# Patient Record
Sex: Female | Born: 1935 | Race: White | Hispanic: No | Smoking: Never smoker
Health system: Southern US, Community
[De-identification: ages and names within clinical notes are randomized; demographics above are authoritative.]

## PROBLEM LIST (undated history)

## (undated) DIAGNOSIS — D45 Polycythemia vera: Secondary | ICD-10-CM

## (undated) DIAGNOSIS — H409 Unspecified glaucoma: Secondary | ICD-10-CM

## (undated) DIAGNOSIS — Z8719 Personal history of other diseases of the digestive system: Secondary | ICD-10-CM

## (undated) DIAGNOSIS — K219 Gastro-esophageal reflux disease without esophagitis: Secondary | ICD-10-CM

## (undated) DIAGNOSIS — R0600 Dyspnea, unspecified: Secondary | ICD-10-CM

## (undated) DIAGNOSIS — E785 Hyperlipidemia, unspecified: Secondary | ICD-10-CM

## (undated) DIAGNOSIS — K851 Biliary acute pancreatitis without necrosis or infection: Secondary | ICD-10-CM

## (undated) DIAGNOSIS — N186 End stage renal disease: Secondary | ICD-10-CM

## (undated) DIAGNOSIS — F329 Major depressive disorder, single episode, unspecified: Secondary | ICD-10-CM

## (undated) DIAGNOSIS — F32A Depression, unspecified: Secondary | ICD-10-CM

## (undated) DIAGNOSIS — I441 Atrioventricular block, second degree: Secondary | ICD-10-CM

## (undated) DIAGNOSIS — I10 Essential (primary) hypertension: Secondary | ICD-10-CM

## (undated) DIAGNOSIS — D471 Chronic myeloproliferative disease: Secondary | ICD-10-CM

## (undated) DIAGNOSIS — Z950 Presence of cardiac pacemaker: Secondary | ICD-10-CM

## (undated) DIAGNOSIS — D649 Anemia, unspecified: Secondary | ICD-10-CM

## (undated) DIAGNOSIS — M1990 Unspecified osteoarthritis, unspecified site: Secondary | ICD-10-CM

## (undated) HISTORY — PX: OTHER SURGICAL HISTORY: SHX169

## (undated) HISTORY — DX: Essential (primary) hypertension: I10

## (undated) HISTORY — PX: CHOLECYSTECTOMY: SHX55

## (undated) HISTORY — DX: Chronic myeloproliferative disease: D47.1

## (undated) HISTORY — DX: Unspecified glaucoma: H40.9

## (undated) HISTORY — DX: Hyperlipidemia, unspecified: E78.5

## (undated) HISTORY — DX: Atrioventricular block, second degree: I44.1

## (undated) HISTORY — DX: Biliary acute pancreatitis without necrosis or infection: K85.10

## (undated) HISTORY — DX: Anemia, unspecified: D64.9

## (undated) HISTORY — PX: PACEMAKER INSERTION: SHX728

## (undated) HISTORY — DX: End stage renal disease: N18.6

## (undated) HISTORY — PX: KNEE ARTHROSCOPY: SUR90

## (undated) HISTORY — DX: Polycythemia vera: D45

---

## 2006-03-28 HISTORY — PX: COLONOSCOPY: SHX174

## 2009-08-28 HISTORY — PX: ESOPHAGOGASTRODUODENOSCOPY: SHX1529

## 2010-09-28 DIAGNOSIS — K851 Biliary acute pancreatitis without necrosis or infection: Secondary | ICD-10-CM

## 2010-09-28 HISTORY — DX: Biliary acute pancreatitis without necrosis or infection: K85.10

## 2012-08-28 HISTORY — PX: ESOPHAGOGASTRODUODENOSCOPY: SHX1529

## 2017-12-28 ENCOUNTER — Encounter: Payer: Self-pay | Admitting: Gastroenterology

## 2018-03-02 ENCOUNTER — Ambulatory Visit (INDEPENDENT_AMBULATORY_CARE_PROVIDER_SITE_OTHER): Payer: Medicare Other | Admitting: Gastroenterology

## 2018-03-02 ENCOUNTER — Encounter: Payer: Self-pay | Admitting: Gastroenterology

## 2018-03-02 DIAGNOSIS — K219 Gastro-esophageal reflux disease without esophagitis: Secondary | ICD-10-CM | POA: Insufficient documentation

## 2018-03-02 DIAGNOSIS — R197 Diarrhea, unspecified: Secondary | ICD-10-CM | POA: Diagnosis not present

## 2018-03-02 DIAGNOSIS — R1030 Lower abdominal pain, unspecified: Secondary | ICD-10-CM

## 2018-03-02 DIAGNOSIS — R109 Unspecified abdominal pain: Secondary | ICD-10-CM | POA: Insufficient documentation

## 2018-03-02 DIAGNOSIS — R112 Nausea with vomiting, unspecified: Secondary | ICD-10-CM | POA: Insufficient documentation

## 2018-03-02 MED ORDER — PANTOPRAZOLE SODIUM 40 MG PO TBEC: 40.0000 mg | DELAYED_RELEASE_TABLET | Freq: Every day | ORAL | 3 refills | Status: DC

## 2018-03-02 NOTE — Patient Instructions (Signed)
1. Stop omeprazole. Start pantoprazole once daily 30 minutes before food.  2. You can take imodium 2mg  up to twice daily as needed for loose stool.  3. I will review your records once received, further recommendations to follow.

## 2018-03-02 NOTE — Progress Notes (Addendum)
REVIEWED-NO ADDITIONAL RECOMMENDATIONS.  Primary Care Physician:  Roderic Scarce, MD Referring physician: Dr. Laurena Slimmer at [facility] Primary Gastroenterologist:  Barney Drain, MD   Chief Complaint  Patient presents with  . Abdominal Pain  . Nausea  . Emesis  . Diarrhea    HPI:  Hannah Tapia is a 84 y.o. female here at the request of Dr. Junius Roads for further evaluation of nausea/vomiting/diarrhea.  Patient has a history of polycythemia vera, macrocytic anemia, chronic kidney disease, GERD.  Complains of intermittent vomiting, diarrhea for over a year.  Complains of fatigue.  Sunday mornings are particularly worse, she tries to go to church but gets nervous about the diarrhea.  Recently has been unable to attend church.  Symptoms are not every day.  She does take one Imodium with relief of her diarrhea.  Never has constipation.  Generally has diarrhea a few days a week and may not have any bowel movements the other days or have normal stools.  No melena or rectal bleeding.  Having a ultrasound or CT scan at [place] [facility] around March.  We have requested those records.   Patient states she vomits too frequently.  Not every day but often wakes up in the melanite with vomiting.    Remotely has a history of biliary pancreatitis, had her gallbladder removed at that time.  This was around 2012.  Around the same time she had an EGD and colonoscopy with Dr. West Carbo.  We have requested those records.    As far as abdominal pain she does have some on the left side on occasion but usually lower abdomen.  She denies any unintentional weight loss.  She has frequent heartburn, has been on TUMS and omeprazole for years. Tums helps the nausea sometimes.  She reports having a large hiatal hernia.  Denies dysphagia.  She takes Mobic every day and has been on it for a while.   Daughter is concerned her mother does not eat enough or eat the right things.  Mother states she eats when she is  hungry, she is by herself and therefore does not really cook.  Current Outpatient Medications  Medication Sig Dispense Refill  . aspirin EC 81 MG tablet Take 1 tablet by mouth daily.    . brimonidine (ALPHAGAN) 0.2 % ophthalmic solution Place 1 drop into both eyes 2 (two) times daily.    . carvedilol (COREG) 12.5 MG tablet Take 1 tablet by mouth 2 (two) times daily.    . citalopram (CELEXA) 20 MG tablet Take 1 tablet by mouth at bedtime.    . hydroxyurea (HYDREA) 500 MG capsule Take 1 capsule by mouth daily.    Marland Kitchen latanoprost (XALATAN) 0.005 % ophthalmic solution Place 1 drop into both eyes at bedtime.  5  . meloxicam (MOBIC) 15 MG tablet Take 1 tablet by mouth daily.    . Multiple Vitamin (MULTI-VITAMINS) TABS Take 1 tablet by mouth daily.    . nitroGLYCERIN (NITROSTAT) 0.4 MG SL tablet Place 1 tablet under the tongue as needed.    Marland Kitchen omeprazole (PRILOSEC) 20 MG capsule Take 1 capsule by mouth daily.  2  . pravastatin (PRAVACHOL) 20 MG tablet Take 1 tablet by mouth daily.    . ranitidine (ZANTAC) 300 MG capsule Take 1 capsule by mouth at bedtime.    . vitamin B-12 (CYANOCOBALAMIN) 1000 MCG tablet Take 1 tablet by mouth daily.     No current facility-administered medications for this visit.  Allergies as of 03/02/2018 - Review Complete 03/02/2018  Allergen Reaction Noted  . Sulfa antibiotics Rash 03/02/2018    Past Medical History:  Diagnosis Date  . Anemia   . ESRD (end stage renal disease) ([place])   . Glaucoma   . HTN (hypertension)   . Hyperlipidemia   . Mobitz type 2 second degree atrioventricular block   . Myeloproliferative disorder ([place])   . Polycythemia vera ([place])     Past Surgical History:  Procedure Laterality Date  . carpel tunnel right    . CHOLECYSTECTOMY    . KNEE ARTHROSCOPY Bilateral   . PACEMAKER INSERTION      Family History  Adopted: Yes    Social History   Socioeconomic History  . Marital status: Widowed    Spouse name: Not on file  . Number  of children: Not on file  . Years of education: Not on file  . Highest education level: Not on file  Occupational History  . Not on file  Social Needs  . Financial resource strain: Not on file  . Food insecurity:    Worry: Not on file    Inability: Not on file  . Transportation needs:    Medical: Not on file    Non-medical: Not on file  Tobacco Use  . Smoking status: Never Smoker  . Smokeless tobacco: Never Used  Substance and Sexual Activity  . Alcohol use: Never    Frequency: Never  . Drug use: Never  . Sexual activity: Not on file  Lifestyle  . Physical activity:    Days per week: Not on file    Minutes per session: Not on file  . Stress: Not on file  Relationships  . Social connections:    Talks on phone: Not on file    Gets together: Not on file    Attends religious service: Not on file    Active member of club or organization: Not on file    Attends meetings of clubs or organizations: Not on file    Relationship status: Not on file  . Intimate partner violence:    Fear of current or ex partner: Not on file    Emotionally abused: Not on file    Physically abused: Not on file    Forced sexual activity: Not on file  Other Topics Concern  . Not on file  Social History Narrative  . Not on file      ROS:  General: Negative for anorexia, weight loss, fever, chills,  Weakness. +fatigue Eyes: Negative for vision changes.  ENT: Negative for hoarseness, difficulty swallowing , nasal congestion. CV: Negative for chest pain, angina, palpitations, dyspnea on exertion, peripheral edema.  Respiratory: Negative for dyspnea at rest, dyspnea on exertion, cough, sputum, wheezing.  GI: See history of present illness. GU:  Negative for dysuria, hematuria, urinary incontinence, urinary frequency, nocturnal urination.  MS: Negative for joint pain, low back pain.  Derm: Negative for rash or itching.  Neuro: Negative for weakness, abnormal sensation, seizure, frequent headaches,  memory loss, confusion.  Psych: Negative for anxiety, depression, suicidal ideation, hallucinations.  Endo: Negative for unusual weight change.  Heme: Negative for bruising or bleeding. Allergy: Negative for rash or hives.    Physical Examination:  BP 111/62   Pulse 71   Temp 98 F (36.7 C) (Oral)   Ht 5\' 2"  (1.575 m)   Wt 143 lb 6.4 oz (65 kg)   BMI 26.23 kg/m    General: Well-nourished, well-developed in no  acute distress. Accompanied by daughter Head: Normocephalic, atraumatic.   Eyes: Conjunctiva pink, no icterus. Mouth: Oropharyngeal mucosa moist and pink , no lesions erythema or exudate. Neck: Supple without thyromegaly, masses, or lymphadenopathy.  Lungs: Clear to auscultation bilaterally.  Heart: Regular rate and rhythm, no murmurs rubs or gallops.  Abdomen: Bowel sounds are normal, nontender, nondistended, no hepatosplenomegaly or masses, no abdominal bruits or    hernia , no rebound or guarding.   Rectal: not performed Extremities: No lower extremity edema. No clubbing or deformities.  Neuro: Alert and oriented x 4 , grossly normal neurologically.  Skin: Warm and dry, no rash or jaundice.   Psych: Alert and cooperative, normal mood and affect.  Labs: 12/08/17: WBC 7620, hemoglobin 10.9 low, hematocrit 34.2 low, MCV 114.4 high, platelets 616,000 high  Imaging Studies: No results found.  Impression/plan:  84 year old female with history of polycythemia vera, stage II chronic kidney disease, macrocytic anemia, GERD who presents for further evaluation of nausea/vomiting/diarrhea all intermittent nature.  No unintentional weight loss.  Suspect her nausea and vomiting may be related to refractory GERD.  We will switch her over to pantoprazole 40 mg daily.  As far as diarrhea, unlikely related to an infectious etiology given alternating formed stools.  May be secondary to bile acid diarrhea, less likely pancreatic insufficiency ischemia.  I have requested CT and ultrasound  reports, reports a previous colonoscopy and EGD for further review.  Further recommendations to follow.  In the meantime she will take Imodium 2 mg 1-2 times daily as needed for loose stools.

## 2018-03-03 ENCOUNTER — Encounter: Payer: Self-pay | Admitting: Gastroenterology

## 2018-03-03 NOTE — Progress Notes (Signed)
Please send copy to Dr. Junius Roads.

## 2018-03-04 ENCOUNTER — Encounter: Payer: Self-pay | Admitting: Gastroenterology

## 2018-03-04 ENCOUNTER — Telehealth: Payer: Self-pay | Admitting: Gastroenterology

## 2018-03-04 NOTE — Progress Notes (Signed)
cc'ed to pcp °

## 2018-03-04 NOTE — Telephone Encounter (Signed)
Records from Dr. West Carbo:  TCS 03/2006: extensive diverticulosis sigmoid colon. Next TCS 2017.   EGD 08/2009: hiatal hernia with probable short segment Barrett's, gastritis. Path: gastritis with intestinal metaplasia (No H.pylori).   EGD 08/2012: diffuse gastritis, distal esophageal erosions. Path: mild chronic gastritis, Barrett's without dysplasia.   STILL WAITING ON THE RECORDS FROM U/S AND CT AT ?[facility].

## 2018-03-04 NOTE — Progress Notes (Signed)
cc'ed to pcp and Dr Junius Roads

## 2018-03-08 NOTE — Telephone Encounter (Signed)
I have faxed the release of records again

## 2018-03-25 NOTE — Telephone Encounter (Signed)
LMOM to call.

## 2018-03-25 NOTE — Telephone Encounter (Addendum)
Received records.  CT chest/abdomen/pelvis without contrast March 2019: Extrahepatic biliary dilatation likely secondary to prior cholecystectomy area tiny calcifications throughout the liver and spleen likely reflective of prior granulomatous disease.  Pancreas normal appearing.  Vaginal pessary noted.  Knees atrophic.  Small hiatal hernia.   PLEASE LET PATIENT KNOW WE SHOULD CONSIDER THE FOLLOWING:  CBC, IRON/TIBC/FERRITIN, TTG IGA, IGA LEVEL.  HOW IS HER N/V/D?  SHE HAS H/O BARRETT'S SO WILL LIKELY NEED EGD IN NEAR FUTURE BUT AWAIT LABS.

## 2018-03-28 ENCOUNTER — Other Ambulatory Visit: Payer: Self-pay

## 2018-03-28 DIAGNOSIS — R112 Nausea with vomiting, unspecified: Secondary | ICD-10-CM

## 2018-03-28 DIAGNOSIS — K219 Gastro-esophageal reflux disease without esophagitis: Secondary | ICD-10-CM

## 2018-03-28 NOTE — Telephone Encounter (Signed)
Pt's daughter, Lolita Patella, has been informed. She said pt is feeling much better and the new reflux medication has helped a lot. No real complaints at this time. She would like the lab orders to be faxed to [facility] in [place] and they will try to do in the next few days.

## 2018-03-28 NOTE — Telephone Encounter (Signed)
Lab orders faxed to [phone].

## 2018-03-30 NOTE — Telephone Encounter (Signed)
Noted. Will await labs.

## 2018-04-05 NOTE — Telephone Encounter (Signed)
Received a call from [facility], [employer]. The lab orders are all separate and she is requesting we fax over order on Prescription pad so the pt will not have to sign 4 different requisitions. I wrote them down and faxed back to [phone].

## 2018-04-06 ENCOUNTER — Other Ambulatory Visit: Payer: Self-pay | Admitting: Gastroenterology

## 2018-04-07 LAB — IRON AND TIBC
Iron Saturation: 32 % (ref 15–55)
Iron: 94 ug/dL (ref 27–139)
TIBC: 290 ug/dL (ref 250–450)
UIBC: 196 ug/dL (ref 118–369)

## 2018-04-07 LAB — CBC/DIFF AMBIGUOUS DEFAULT
BASOS ABS: 0 10*3/uL (ref 0.0–0.2)
Basos: 0 %
EOS (ABSOLUTE): 0.1 10*3/uL (ref 0.0–0.4)
Eos: 1 %
HEMATOCRIT: 31.4 % — AB (ref 34.0–46.6)
HEMOGLOBIN: 10.3 g/dL — AB (ref 11.1–15.9)
Immature Grans (Abs): 0 10*3/uL (ref 0.0–0.1)
Immature Granulocytes: 1 %
LYMPHS ABS: 1.3 10*3/uL (ref 0.7–3.1)
Lymphs: 25 %
MCH: 37.5 pg — AB (ref 26.6–33.0)
MCHC: 32.8 g/dL (ref 31.5–35.7)
MCV: 114 fL — ABNORMAL HIGH (ref 79–97)
MONOCYTES: 12 %
MONOS ABS: 0.6 10*3/uL (ref 0.1–0.9)
NEUTROS ABS: 3.2 10*3/uL (ref 1.4–7.0)
Neutrophils: 61 %
Platelets: 509 10*3/uL — ABNORMAL HIGH (ref 150–450)
RBC: 2.75 x10E6/uL — ABNORMAL LOW (ref 3.77–5.28)
RDW: 13.7 % (ref 12.3–15.4)
WBC: 5.2 10*3/uL (ref 3.4–10.8)

## 2018-04-07 LAB — FERRITIN: Ferritin: 145 ng/mL (ref 15–150)

## 2018-04-07 LAB — IGA: IgA/Immunoglobulin A, Serum: 260 mg/dL (ref 64–422)

## 2018-04-07 LAB — TISSUE TRANSGLUTAMINASE, IGA

## 2018-04-07 NOTE — Patient Instructions (Signed)
CBC/Diff, Iron, TIBC, Immunoglobulin A, Ferritin results placed in LSL's box. RBC 2.75, Hgb 10.3, Hematocrit 31.4, Platelets 509.

## 2018-04-12 NOTE — Telephone Encounter (Signed)
Labs received from [facility] dated 04/06/2018 White blood cell count 5200, hemoglobin 10.3, hematocrit 31.4, MCV 114, platelets 509,000.  Iron 94, iron saturations 32%, TIBC 290, ferritin 145, IgA 260, TTG pending.   Hemoglobin and MCV stable from March 2019.  History of polycythemia vera followed by hematology.   PLEASE GET FINAL RESULTS FOR THE TTG, ?LOOK ON [facility] WEBSITE.  HAVE PATIENT COLLECT IFOBT.  PATIENT NEEDS EGD FOR H/O BARRETT'S. PLEASE SCHEDULE WITH [facility].

## 2018-04-12 NOTE — Telephone Encounter (Signed)
[facility] is faxing result of the TTG/IGA.

## 2018-04-12 NOTE — Telephone Encounter (Signed)
I have received the results of the TTG/IGA and placing in Hannah Tapia's box.

## 2018-04-13 NOTE — Telephone Encounter (Signed)
TTG IgA negative.   Please arrange for EGD and ifobt as outlined.

## 2018-04-14 ENCOUNTER — Other Ambulatory Visit: Payer: Self-pay | Admitting: *Deleted

## 2018-04-14 DIAGNOSIS — Z8719 Personal history of other diseases of the digestive system: Secondary | ICD-10-CM

## 2018-04-14 NOTE — Telephone Encounter (Signed)
PT's daughter, Hilda Blades is aware of the results and plan. She will come by to pick up the iFOBT ( leaving at front desk and I have reviewed instructions with her).  OK to schedule EGD. She is requesting a Wednesday if at all possible.  Forwarding to [facility].

## 2018-04-14 NOTE — Telephone Encounter (Signed)
Spoke with pt daughter Neoma Laming. EGD with [facility] scheduled for 05/02/18 at 2:45pm. Aware will need to arrive at the [facility] at 1:45pm. I have mailed instructions for EGD (confirmed mailing address).

## 2018-04-27 ENCOUNTER — Telehealth: Payer: Self-pay

## 2018-04-27 NOTE — Telephone Encounter (Signed)
Opened in error

## 2018-04-27 NOTE — Telephone Encounter (Signed)
Called and spoke to pt's daughter Neoma Laming), asked if she could arrive earlier for EGD 05/02/18 d/t cancellations. She said no, she's out of town and she can't work it out for her to arrive earlier. LMOVM and informed endo scheduler.

## 2018-05-02 ENCOUNTER — Encounter (HOSPITAL_COMMUNITY): Payer: Self-pay | Admitting: Gastroenterology

## 2018-05-02 ENCOUNTER — Ambulatory Visit (HOSPITAL_COMMUNITY)
Admission: RE | Admit: 2018-05-02 | Discharge: 2018-05-02 | Disposition: A | Discharge status: Home / Self Care | Payer: Medicare Other | Source: Ambulatory Visit | Attending: Gastroenterology | Admitting: Gastroenterology

## 2018-05-02 ENCOUNTER — Encounter (HOSPITAL_COMMUNITY)
Admission: RE | Disposition: A | Discharge status: Home / Self Care | Payer: Self-pay | Source: Ambulatory Visit | Attending: Gastroenterology

## 2018-05-02 ENCOUNTER — Other Ambulatory Visit: Payer: Self-pay

## 2018-05-02 DIAGNOSIS — Z7982 Long term (current) use of aspirin: Secondary | ICD-10-CM | POA: Diagnosis not present

## 2018-05-02 DIAGNOSIS — H409 Unspecified glaucoma: Secondary | ICD-10-CM | POA: Insufficient documentation

## 2018-05-02 DIAGNOSIS — R197 Diarrhea, unspecified: Secondary | ICD-10-CM | POA: Insufficient documentation

## 2018-05-02 DIAGNOSIS — K449 Diaphragmatic hernia without obstruction or gangrene: Secondary | ICD-10-CM | POA: Insufficient documentation

## 2018-05-02 DIAGNOSIS — K2970 Gastritis, unspecified, without bleeding: Secondary | ICD-10-CM | POA: Diagnosis not present

## 2018-05-02 DIAGNOSIS — Z79899 Other long term (current) drug therapy: Secondary | ICD-10-CM | POA: Insufficient documentation

## 2018-05-02 DIAGNOSIS — Z9049 Acquired absence of other specified parts of digestive tract: Secondary | ICD-10-CM | POA: Insufficient documentation

## 2018-05-02 DIAGNOSIS — Z950 Presence of cardiac pacemaker: Secondary | ICD-10-CM | POA: Insufficient documentation

## 2018-05-02 DIAGNOSIS — Z791 Long term (current) use of non-steroidal anti-inflammatories (NSAID): Secondary | ICD-10-CM | POA: Insufficient documentation

## 2018-05-02 DIAGNOSIS — K3189 Other diseases of stomach and duodenum: Secondary | ICD-10-CM | POA: Insufficient documentation

## 2018-05-02 DIAGNOSIS — K219 Gastro-esophageal reflux disease without esophagitis: Secondary | ICD-10-CM | POA: Diagnosis not present

## 2018-05-02 DIAGNOSIS — F329 Major depressive disorder, single episode, unspecified: Secondary | ICD-10-CM | POA: Diagnosis not present

## 2018-05-02 DIAGNOSIS — E785 Hyperlipidemia, unspecified: Secondary | ICD-10-CM | POA: Diagnosis not present

## 2018-05-02 DIAGNOSIS — Z8719 Personal history of other diseases of the digestive system: Secondary | ICD-10-CM

## 2018-05-02 DIAGNOSIS — N186 End stage renal disease: Secondary | ICD-10-CM | POA: Diagnosis not present

## 2018-05-02 DIAGNOSIS — K2950 Unspecified chronic gastritis without bleeding: Secondary | ICD-10-CM | POA: Diagnosis not present

## 2018-05-02 DIAGNOSIS — I120 Hypertensive chronic kidney disease with stage 5 chronic kidney disease or end stage renal disease: Secondary | ICD-10-CM | POA: Insufficient documentation

## 2018-05-02 DIAGNOSIS — R112 Nausea with vomiting, unspecified: Secondary | ICD-10-CM | POA: Insufficient documentation

## 2018-05-02 DIAGNOSIS — Z882 Allergy status to sulfonamides status: Secondary | ICD-10-CM | POA: Insufficient documentation

## 2018-05-02 HISTORY — DX: Major depressive disorder, single episode, unspecified: F32.9

## 2018-05-02 HISTORY — DX: Personal history of other diseases of the digestive system: Z87.19

## 2018-05-02 HISTORY — DX: Gastro-esophageal reflux disease without esophagitis: K21.9

## 2018-05-02 HISTORY — DX: Presence of cardiac pacemaker: Z95.0

## 2018-05-02 HISTORY — DX: Dyspnea, unspecified: R06.00

## 2018-05-02 HISTORY — DX: Unspecified osteoarthritis, unspecified site: M19.90

## 2018-05-02 HISTORY — PX: ESOPHAGOGASTRODUODENOSCOPY: SHX5428

## 2018-05-02 HISTORY — DX: Depression, unspecified: F32.A

## 2018-05-02 SURGERY — EGD (ESOPHAGOGASTRODUODENOSCOPY)
Anesthesia: Moderate Sedation

## 2018-05-02 MED ORDER — MEPERIDINE HCL 100 MG/ML IJ SOLN
INTRAMUSCULAR | Status: AC
  Filled 2018-05-02: qty 2

## 2018-05-02 MED ORDER — MIDAZOLAM HCL 5 MG/5ML IJ SOLN
INTRAMUSCULAR | Status: AC
  Filled 2018-05-02: qty 10

## 2018-05-02 MED ORDER — LIDOCAINE VISCOUS HCL 2 % MT SOLN
OROMUCOSAL | Status: AC
  Filled 2018-05-02: qty 15

## 2018-05-02 MED ORDER — MIDAZOLAM HCL 5 MG/5ML IJ SOLN
INTRAMUSCULAR | Status: DC | PRN
  Administered 2018-05-02 (×2): 2 mg via INTRAVENOUS

## 2018-05-02 MED ORDER — LIDOCAINE VISCOUS HCL 2 % MT SOLN
OROMUCOSAL | Status: DC | PRN
  Administered 2018-05-02: 1 via OROMUCOSAL

## 2018-05-02 MED ORDER — SODIUM CHLORIDE 0.9 % IV SOLN
INTRAVENOUS | Status: DC
  Administered 2018-05-02: 1000 mL via INTRAVENOUS

## 2018-05-02 MED ORDER — STERILE WATER FOR IRRIGATION IR SOLN
Status: DC | PRN
  Administered 2018-05-02: 1.5 mL

## 2018-05-02 MED ORDER — MEPERIDINE HCL 100 MG/ML IJ SOLN
INTRAMUSCULAR | Status: DC | PRN
  Administered 2018-05-02 (×2): 25 mg via INTRAVENOUS

## 2018-05-02 NOTE — Op Note (Signed)
[facility] Patient Name: Hannah Tapia Procedure Date: 05/02/2018 3:32 PM MRN: [id number] Date of Birth: [birth year] Attending MD: Barney Drain MD, MD CSN: [id number] Age: 84 Admit Type: Outpatient Procedure:                Upper GI endoscopy WITH COLD FORCEPS BIOPSY Indications:              Diarrhea, Nausea with vomiting Providers:                Barney Drain MD, MD, Lurline Del, RN, Nelma Rothman,                            Technician Referring MD:             Valentina Shaggy, MD Medicines:                Meperidine 50 mg IV, Midazolam 3 mg IV Complications:            No immediate complications. Estimated Blood Loss:     Estimated blood loss was minimal. Procedure:                Pre-Anesthesia Assessment:                           - Prior to the procedure, a History and Physical                            was performed, and patient medications and                            allergies were reviewed. The patient's tolerance of                            previous anesthesia was also reviewed. The risks                            and benefits of the procedure and the sedation                            options and risks were discussed with the patient.                            All questions were answered, and informed consent                            was obtained. Prior Anticoagulants: The patient has                            taken aspirin, last dose was 1 day prior to                            procedure. ASA Grade Assessment: II - A patient                            with mild systemic disease. After reviewing the  risks and benefits, the patient was deemed in                            satisfactory condition to undergo the procedure.                            After obtaining informed consent, the endoscope was                            passed under direct vision. Throughout the                            procedure, the patient's blood pressure, pulse,  and                            oxygen saturations were monitored continuously. The                            [id number]-[id number] ([id number]) scope was introduced through the                            mouth, and advanced to the second part of duodenum.                            The upper GI endoscopy was accomplished without                            difficulty. The patient tolerated the procedure                            well. Scope In: 3:33:22 PM Scope Out: 3:44:31 PM Total Procedure Duration: 0 hours 11 minutes 9 seconds  Findings:      The examined esophagus was normal.      A small hiatal hernia was present.      Diffuse moderate inflammation characterized by congestion (edema),       erythema and friability was found in the entire examined stomach.       Biopsies(3: ANTRUM, 2:BODY) were taken with a cold forceps for       Helicobacter pylori testing.      The examined duodenum was normal. Biopsies(2: BULB, 4: 2ND PORTION) for       histology were taken with a cold forceps for evaluation of celiac       disease. Impression:               - Normal esophagus.                           - Small hiatal hernia.                           - Gastritis. Biopsied.                           - Moderate Sedation:      Moderate (conscious) sedation was administered by the endoscopy nurse  and supervised by the endoscopist. The following parameters were       monitored: oxygen saturation, heart rate, blood pressure, and response       to care. Total physician intraservice time was 26 minutes. Recommendation:           - Patient has a contact number available for                            emergencies. The signs and symptoms of potential                            delayed complications were discussed with the                            patient. Return to normal activities tomorrow.                            Written discharge instructions were provided to the                            patient.                            - Low fat diet.                           - Continue present medications. HOLD ASA. RE-START                            AUG 10. PROTONIX QAC BREAKFAST.                           - Await pathology results. CONSIDER ABX FOR SIBO RO                            ADDING CREON WITH MEALS TO CONTROL DIARRHEA.                           - Return to my office in 3 months. Procedure Code(s):        --- Professional ---                           [id number], Esophagogastroduodenoscopy, flexible,                            transoral; with biopsy, single or multiple                           G0500, Moderate sedation services provided by the                            same physician or other qualified health care                            professional performing a gastrointestinal  endoscopic service that sedation supports,                            requiring the presence of an independent trained                            observer to assist in the monitoring of the                            patient's level of consciousness and physiological                            status; initial 15 minutes of intra-service time;                            patient age 28 years or older (additional time may                            be reported with [phone], as appropriate)                           [phone], Moderate sedation services provided by the                            same physician or other qualified health care                            professional performing the diagnostic or                            therapeutic service that the sedation supports,                            requiring the presence of an independent trained                            observer to assist in the monitoring of the                            patient's level of consciousness and physiological                            status; each additional 15 minutes intraservice                             time (List separately in addition to code for                            primary service) Diagnosis Code(s):        --- Professional ---                           K44.9, Diaphragmatic hernia without obstruction or  gangrene                           K29.70, Gastritis, unspecified, without bleeding                           R19.7, Diarrhea, unspecified                           R11.2, Nausea with vomiting, unspecified CPT copyright 2017 [employer] Medical Association. All rights reserved. The codes documented in this report are preliminary and upon coder review may  be revised to meet current compliance requirements. Barney Drain, MD Barney Drain MD, MD 05/02/2018 3:58:39 PM This report has been signed electronically. Number of Addenda: 0

## 2018-05-02 NOTE — Discharge Instructions (Signed)
YOUR NAUSEA & INTERMITTENT VOMITING ARE DUE TO gastritis BECAUSE YOU'RE USING ASPIRIN, AND REFLUX. NO SOURCE FOR THE DIARRHEA WAS IDENTIFIED BUT IT'S MOST LIKLEY DUE TO INTOLERANCE TO FAT AND DAIRY IN YOUR DIET.  YOU HAVE A SMALL HIATAL HERNIA. I biopsied your stomach & SMALL BOWEL.   HOLD aspirin. Re-start AUG 10.  DRINK WATER TO KEEP YOUR URINE LIGHT YELLOW.  AVOID REFLUX TRIGGERS. SEE INFO BELOW.   CONTINUE PROTONIX. TAKE 30 MINUTES PRIOR TO BREAKFAST.  PRIOR TO EATING OUT, TAKE one Imodium BEFORE YOU LEAVE THE HOUSE.   With a MEAL CHEW ONE TUMS WITH MEALS UP TO THREE TIMES A  DAY TO PREVENT DIARRHEA.  YOUR BIOPSY WILL BE BACK IN 7 DAYS.   PLEASE CALL IN ONE MONTH IF SYMPTOMS ARE NOT IMPROVED.   FOLLOW UP IN NOV 2019 WITH DR. Emeree Mahler.   UPPER ENDOSCOPY AFTER CARE Read the instructions outlined below and refer to this sheet in the next week. These discharge instructions provide you with general information on caring for yourself after you leave the hospital. While your treatment has been planned according to the most current medical practices available, unavoidable complications occasionally occur. If you have any problems or questions after discharge, call DR. Venera Privott, [phone].  ACTIVITY  You may resume your regular activity, but move at a slower pace for the next 24 hours.   Take frequent rest periods for the next 24 hours.   Walking will help get rid of the air and reduce the bloated feeling in your belly (abdomen).   No driving for 24 hours (because of the medicine (anesthesia) used during the test).   You may shower.   Do not sign any important legal documents or operate any machinery for 24 hours (because of the anesthesia used during the test).    NUTRITION  Drink plenty of fluids.   You may resume your normal diet as instructed by your doctor.   Begin with a light meal and progress to your normal diet. Heavy or fried foods are harder to digest and may  make you feel sick to your stomach (nauseated).   Avoid alcoholic beverages for 24 hours or as instructed.    MEDICATIONS  You may resume your normal medications.   WHAT YOU CAN EXPECT TODAY  Some feelings of bloating in the abdomen.   Passage of more gas than usual.    IF YOU HAD A BIOPSY TAKEN DURING THE UPPER ENDOSCOPY:  Eat a soft diet IF YOU HAVE NAUSEA, BLOATING, ABDOMINAL PAIN, OR VOMITING.    FINDING OUT THE RESULTS OF YOUR TEST Not all test results are available during your visit. DR. Oneida Alar WILL CALL YOU WITHIN 14 DAYS OF YOUR PROCEDUE WITH YOUR RESULTS. Do not assume everything is normal if you have not heard from DR. Chioma Mukherjee, CALL HER OFFICE AT [phone].  SEEK IMMEDIATE MEDICAL ATTENTION AND CALL THE OFFICE: [phone] IF:  You have more than a spotting of blood in your stool.   Your belly is swollen (abdominal distention).   You are nauseated or vomiting.   You have a temperature over 101F.   You have abdominal pain or discomfort that is severe or gets worse throughout the day.   Gastritis  Gastritis is an inflammation (the body's way of reacting to injury and/or infection) of the stomach. It is often caused by bacterial (germ) infections. It can also be caused BY ASPIRIN, BC/GOODY POWDER'S, (IBUPROFEN) MOTRIN, OR ALEVE (NAPROXEN), chemicals (including alcohol), SPICY FOODS, and medications.  This illness may be associated with generalized malaise (feeling tired, not well), UPPER ABDOMINAL STOMACH cramps, and fever. One common bacterial cause of gastritis is an organism known as H. Pylori. This can be treated with antibiotics.   REFLUX   TREATMENT There are a number of non-prescription medicines used to treat reflux including: Antacids.  ZANTAC OR PEPCID Proton-pump inhibitors: PROTONIX    Lifestyle and home remedies TO CONTROL HEARTBURN You may eliminate or reduce the frequency of heartburn by making the following lifestyle changes:   Control  your weight. Being overweight is a major risk factor for heartburn and GERD. Excess pounds put pressure on your abdomen, pushing up your stomach and causing acid to back up into your esophagus.    Eat smaller meals. 4 TO 6 MEALS A DAY. This reduces pressure on the lower esophageal sphincter, helping to prevent the valve from opening and acid from washing back into your esophagus.    Loosen your belt. Clothes that fit tightly around your waist put pressure on your abdomen and the lower esophageal sphincter.    Eliminate heartburn triggers. Everyone has specific triggers. Common triggers such as fatty or fried foods, spicy food, tomato sauce, carbonated beverages, alcohol, chocolate, mint, garlic, onion, caffeine and nicotine may make heartburn worse.    Avoid stooping or bending. Tying your shoes is OK. Bending over for longer periods to weed your garden isn't, especially soon after eating.    Don't lie down after a meal. Wait at least three to four hours after eating before going to bed, and don't lie down right after eating.   Alternative medicine  Several home remedies exist for treating GERD, but they provide only temporary relief. They include drinking baking soda (sodium bicarbonate) added to water or drinking other fluids such as baking soda mixed with cream of tartar and water.  Although these liquids create temporary relief by neutralizing, washing away or buffering acids, eventually they aggravate the situation by adding gas and fluid to your stomach, increasing pressure and causing more acid reflux. Further, adding more sodium to your diet may increase your blood pressure and add stress to your heart, and excessive bicarbonate ingestion can alter the acid-base balance in your body.   Low-Fat Diet BREADS, CEREALS, PASTA, RICE, DRIED PEAS, AND BEANS These products are high in carbohydrates and most are low in fat. Therefore, they can be increased in the diet as substitutes for fatty  foods. They too, however, contain calories and should not be eaten in excess. Cereals can be eaten for snacks as well as for breakfast.  Include foods that contain fiber (fruits, vegetables, whole grains, and legumes). Research shows that fiber may lower blood cholesterol levels, especially the water-soluble fiber found in fruits, vegetables, oat products, and legumes. FRUITS AND VEGETABLES It is good to eat fruits and vegetables. Besides being sources of fiber, both are rich in vitamins and some minerals. They help you get the daily allowances of these nutrients. Fruits and vegetables can be used for snacks and desserts. MEATS Limit lean meat, chicken, [place], and fish to no more than 6 ounces per day. Beef, Pork, and Lamb Use lean cuts of beef, pork, and lamb. Lean cuts include:  Extra-lean ground beef.  Arm roast.  Sirloin tip.  Center-cut ham.  Round steak.  Loin chops.  Rump roast.  Tenderloin.  Trim all fat off the outside of meats before cooking. It is not necessary to severely decrease the intake of red meat, but lean choices  should be made. Lean meat is rich in protein and contains a highly absorbable form of iron. Premenopausal women, in particular, should avoid reducing lean red meat because this could increase the risk for low red blood cells (iron-deficiency anemia).  Chicken and [place] These are good sources of protein. The fat of poultry can be reduced by removing the skin and underlying fat layers before cooking. Chicken and [place] can be substituted for lean red meat in the diet. Poultry should not be fried or covered with high-fat sauces. Fish and Shellfish Fish is a good source of protein. Shellfish contain cholesterol, but they usually are low in saturated fatty acids. The preparation of fish is important. Like chicken and [place], they should not be fried or covered with high-fat sauces. EGGS Egg whites contain no fat or cholesterol. They can be eaten often. Try 1 to 2 egg  whites instead of whole eggs in recipes or use egg substitutes that do not contain yolk.  MILK AND DAIRY PRODUCTS Use skim or 1% milk instead of 2% or whole milk. Decrease whole milk, natural, and processed cheeses. Use nonfat or low-fat (2%) cottage cheese or low-fat cheeses made from vegetable oils. Choose nonfat or low-fat (1 to 2%) yogurt. Experiment with evaporated skim milk in recipes that call for heavy cream. Substitute low-fat yogurt or low-fat cottage cheese for sour cream in dips and salad dressings. Have at least 2 servings of low-fat dairy products, such as 2 glasses of skim (or 1%) milk each day to help get your daily calcium intake.  FATS AND OILS Butterfat, lard, and beef fats are high in saturated fat and cholesterol. These should be avoided.Vegetable fats do not contain cholesterol. AVOID coconut oil, palm oil, and palm kernel oil, WHICH are very high in saturated fats. These should be limited. These fats are often used in bakery goods, processed foods, popcorn, oils, and nondairy creamers. Vegetable shortenings and some peanut butters contain hydrogenated oils, which are also saturated fats. Read the labels on these foods and check for saturated vegetable oils.  Desirable liquid vegetable oils are corn oil, cottonseed oil, olive oil, canola oil, safflower oil, soybean oil, and sunflower oil. Peanut oil is not as good, but small amounts are acceptable. Buy a heart-healthy tub margarine that has no partially hydrogenated oils in the ingredients. AVOID Mayonnaise and salad dressings often are made from unsaturated fats.  OTHER EATING TIPS Snacks  Most sweets should be limited as snacks. They tend to be rich in calories and fats, and their caloric content outweighs their nutritional value. Some good choices in snacks are graham crackers, melba toast, soda crackers, bagels (no egg), English muffins, fruits, and vegetables. These snacks are preferable to snack crackers, [place] fries, and  chips. Popcorn should be air-popped or cooked in small amounts of liquid vegetable oil.  Desserts Eat fruit, low-fat yogurt, and fruit ices instead of pastries, cake, and cookies. Sherbet, angel food cake, gelatin dessert, frozen low-fat yogurt, or other frozen products that do not contain saturated fat (pure fruit juice bars, frozen ice pops) are also acceptable.   COOKING METHODS Choose those methods that use little or no fat. They include: Poaching.  Braising.  Steaming.  Grilling.  Baking.  Stir-frying.  Broiling.  Microwaving.  Foods can be cooked in a nonstick pan without added fat, or use a nonfat cooking spray in regular cookware. Limit fried foods and avoid frying in saturated fat. Add moisture to lean meats by using water, broth, cooking wines, and  other nonfat or low-fat sauces along with the cooking methods mentioned above. Soups and stews should be chilled after cooking. The fat that forms on top after a few hours in the refrigerator should be skimmed off. When preparing meals, avoid using excess salt. Salt can contribute to raising blood pressure in some people.  EATING AWAY FROM HOME Order entres, potatoes, and vegetables without sauces or butter. When meat exceeds the size of a deck of cards (3 to 4 ounces), the rest can be taken home for another meal. Choose vegetable or fruit salads and ask for low-calorie salad dressings to be served on the side. Use dressings sparingly. Limit high-fat toppings, such as bacon, crumbled eggs, cheese, sunflower seeds, and olives. Ask for heart-healthy tub margarine instead of butter.  Hiatal Hernia A hiatal hernia occurs when a part of the stomach slides above the diaphragm. The diaphragm is the thin muscle separating the belly (abdomen) from the chest. A hiatal hernia can be something you are born with or develop over time. Hiatal hernias may allow stomach acid to flow back into your esophagus, the tube which carries food from your mouth to  your stomach. If this acid causes problems it is called GERD (gastro-esophageal reflux disease).

## 2018-05-02 NOTE — H&P (Signed)
Primary Care Physician:  Roderic Scarce, MD Primary Gastroenterologist:  Dr. Oneida Alar  Pre-Procedure History & Physical: HPI:  Hannah Tapia is a 84 y.o. female here for NAUSEA/VOMITING/ABDOMINAL PAN/diarrhea.  Past Medical History:  Diagnosis Date  . Anemia   . Arthritis    bilateral knees  . Biliary acute pancreatitis 2012  . Depression   . Dyspnea   . ESRD (end stage renal disease) ([place])   . GERD (gastroesophageal reflux disease)   . Glaucoma   . History of hiatal hernia   . HTN (hypertension)   . Hyperlipidemia   . Mobitz type 2 second degree atrioventricular block   . Myeloproliferative disorder ([place])   . Polycythemia vera ([place])   . Presence of permanent cardiac pacemaker     Past Surgical History:  Procedure Laterality Date  . carpel tunnel right    . CHOLECYSTECTOMY    . COLONOSCOPY  03/2006   Dr. West Carbo: extensive diverticulosis sigmoid colon. next TCS 2017.  Marland Kitchen ESOPHAGOGASTRODUODENOSCOPY  08/2012   Dr. West Carbo: diffuse gastritis, distal esophageal erosions. Path: mild chronic gastirits, Barrett's without dysplasia.   Marland Kitchen ESOPHAGOGASTRODUODENOSCOPY  08/2009   Dr. West Carbo: hiatal hernia with probable short segment Barrett's, gastritis. path: gastrisi with intestinal metaplasia (no h.pylori).  Marland Kitchen KNEE ARTHROSCOPY Bilateral   . PACEMAKER INSERTION      Prior to Admission medications   Medication Sig Start Date End Date Taking? Authorizing Provider  aspirin EC 81 MG tablet Take 81 mg by mouth daily.    Yes [provider]  brimonidine (ALPHAGAN) 0.2 % ophthalmic solution Place 1 drop into both eyes 3 (three) times daily.    Yes [provider]  carvedilol (COREG) 12.5 MG tablet Take 12.5 mg by mouth 2 (two) times daily.  12/23/17  Yes [provider]  citalopram (CELEXA) 20 MG tablet Take 20 mg by mouth at bedtime.    Yes [provider]  ferrous sulfate 325 (65 FE) MG tablet Take 325 mg by mouth every other day.   Yes  [provider]  hydroxyurea (HYDREA) 500 MG capsule Take 500 mg by mouth daily.    Yes [provider]  latanoprost (XALATAN) 0.005 % ophthalmic solution Place 1 drop into both eyes at bedtime. 02/19/18  Yes [provider]  latanoprost (XALATAN) 0.005 % ophthalmic solution Place 1 drop into both eyes at bedtime.   Yes [provider]  meloxicam (MOBIC) 15 MG tablet Take 15 mg by mouth at bedtime.    Yes [provider]  pantoprazole (PROTONIX) 40 MG tablet Take 1 tablet (40 mg total) by mouth daily before breakfast. 03/02/18  Yes Mahala Menghini, PA-C  pravastatin (PRAVACHOL) 20 MG tablet Take 20 mg by mouth daily.    Yes [provider]  ranitidine (ZANTAC) 300 MG capsule Take 300 mg by mouth at bedtime.    Yes [provider]  vitamin B-12 (CYANOCOBALAMIN) 1000 MCG tablet Take 2,500 mcg by mouth daily.    Yes [provider]  nitroGLYCERIN (NITROSTAT) 0.4 MG SL tablet Place 0.4 mg under the tongue every 5 (five) minutes as needed for chest pain.     [provider]    Allergies as of 04/14/2018 - Review Complete 03/02/2018  Allergen Reaction Noted  . Sulfa antibiotics Rash 03/02/2018    Family History  Adopted: Yes    Social History   Socioeconomic History  . Marital status: Widowed    Spouse name: Not on file  .  Number of children: Not on file  . Years of education: Not on file  . Highest education level: Not on file  Occupational History  . Not on file  Social Needs  . Financial resource strain: Not on file  . Food insecurity:    Worry: Not on file    Inability: Not on file  . Transportation needs:    Medical: Not on file    Non-medical: Not on file  Tobacco Use  . Smoking status: Never Smoker  . Smokeless tobacco: Never Used  Substance and Sexual Activity  . Alcohol use: Never    Frequency: Never  . Drug use: Never  . Sexual activity: Not on file  Lifestyle  . Physical activity:     Days per week: Not on file    Minutes per session: Not on file  . Stress: Not on file  Relationships  . Social connections:    Talks on phone: Not on file    Gets together: Not on file    Attends religious service: Not on file    Active member of club or organization: Not on file    Attends meetings of clubs or organizations: Not on file    Relationship status: Not on file  . Intimate partner violence:    Fear of current or ex partner: Not on file    Emotionally abused: Not on file    Physically abused: Not on file    Forced sexual activity: Not on file  Other Topics Concern  . Not on file  Social History Narrative  . Not on file    Review of Systems: See HPI, otherwise negative ROS   Physical Exam: BP (!) 146/59   Pulse 90   Temp 98 F (36.7 C) (Oral)   Resp 17   Ht 5\' 2"  (1.575 m)   Wt 148 lb (67.1 kg)   SpO2 94%   BMI 27.07 kg/m  General:   Alert,  pleasant and cooperative in NAD Head:  Normocephalic and atraumatic. Neck:  Supple; Lungs:  Clear throughout to auscultation.    Heart:  Regular rate and rhythm. Abdomen:  Soft, nontender and nondistended. Normal bowel sounds, without guarding, and without rebound.   Neurologic:  Alert and  oriented x4;  grossly normal neurologically.  Impression/Plan:     NAUSEA/VOMITING/ABDOMINAL PAN/diarrhea  PLAN: EGD TODAY. DISCUSSED PROCEDURE, BENEFITS, & RISKS: < 1% chance of medication reaction, bleeding, OR perforation.

## 2018-05-05 DIAGNOSIS — Z8719 Personal history of other diseases of the digestive system: Secondary | ICD-10-CM

## 2018-05-06 ENCOUNTER — Encounter (HOSPITAL_COMMUNITY): Payer: Self-pay | Admitting: Gastroenterology

## 2018-05-10 ENCOUNTER — Encounter: Payer: Self-pay | Admitting: Gastroenterology

## 2018-05-12 ENCOUNTER — Telehealth: Payer: Self-pay | Admitting: Gastroenterology

## 2018-05-12 NOTE — Telephone Encounter (Signed)
PT's daughter, Deborah, is aware.  

## 2018-05-12 NOTE — Telephone Encounter (Signed)
Please call pt. HER stomach Bx shows mild gastritis/DUODENITIS DUE TO ASA OR MOBIC.    DRINK WATER TO KEEP YOUR URINE LIGHT YELLOW.  AVOID REFLUX TRIGGERS.  CONTINUE PROTONIX. TAKE 30 MINUTES PRIOR TO BREAKFAST. PRIOR TO EATING OUT, TAKE one Imodium BEFORE YOU LEAVE THE HOUSE.  With a MEAL CHEW ONE TUMS WITH MEALS UP TO THREE TIMES A  DAY TO PREVENT DIARRHEA.  PLEASE CALL IN ONE MONTH IF SYMPTOMS ARE NOT IMPROVED.  FOLLOW UP IN NOV 2019 WITH DR. Fermin Yan E30 DYSPEPSIA/GASTRITIS/DUODENITIS.

## 2018-05-13 NOTE — Telephone Encounter (Signed)
PATIENT SCHEDULED  °

## 2018-08-16 ENCOUNTER — Ambulatory Visit: Payer: PRIVATE HEALTH INSURANCE | Admitting: Gastroenterology

## 2018-08-17 ENCOUNTER — Ambulatory Visit: Payer: PRIVATE HEALTH INSURANCE | Admitting: Gastroenterology

## 2018-10-27 ENCOUNTER — Other Ambulatory Visit: Payer: Self-pay

## 2018-10-27 ENCOUNTER — Ambulatory Visit (INDEPENDENT_AMBULATORY_CARE_PROVIDER_SITE_OTHER): Payer: Medicare Other | Admitting: Gastroenterology

## 2018-10-27 ENCOUNTER — Ambulatory Visit (HOSPITAL_COMMUNITY)
Admission: RE | Admit: 2018-10-27 | Discharge: 2018-10-27 | Disposition: A | Discharge status: Home / Self Care | Payer: Medicare Other | Source: Ambulatory Visit | Attending: Gastroenterology | Admitting: Gastroenterology

## 2018-10-27 ENCOUNTER — Encounter: Payer: Self-pay | Admitting: Gastroenterology

## 2018-10-27 DIAGNOSIS — R269 Unspecified abnormalities of gait and mobility: Secondary | ICD-10-CM

## 2018-10-27 DIAGNOSIS — R1030 Lower abdominal pain, unspecified: Secondary | ICD-10-CM

## 2018-10-27 DIAGNOSIS — H81399 Other peripheral vertigo, unspecified ear: Secondary | ICD-10-CM

## 2018-10-27 DIAGNOSIS — R197 Diarrhea, unspecified: Secondary | ICD-10-CM | POA: Diagnosis not present

## 2018-10-27 DIAGNOSIS — H8110 Benign paroxysmal vertigo, unspecified ear: Secondary | ICD-10-CM

## 2018-10-27 DIAGNOSIS — R112 Nausea with vomiting, unspecified: Secondary | ICD-10-CM

## 2018-10-27 MED ORDER — PANCRELIPASE (LIP-PROT-AMYL) 36000-114000 UNITS PO CPEP
ORAL_CAPSULE | ORAL | 11 refills | Status: DC
Start: 2018-10-27 — End: 2020-08-06

## 2018-10-27 MED ORDER — AEROCHAMBER MINI CHAMBER DEVI: 0 refills | Status: DC

## 2018-10-27 MED ORDER — MECLIZINE HCL 12.5 MG PO TABS: ORAL_TABLET | ORAL | 0 refills | Status: AC

## 2018-10-27 MED ORDER — BUDESONIDE-FORMOTEROL FUMARATE 80-4.5 MCG/ACT IN AERO: 2.0000 | INHALATION_SPRAY | Freq: Two times a day (BID) | RESPIRATORY_TRACT | 12 refills | Status: AC

## 2018-10-27 NOTE — Patient Instructions (Addendum)
DRINK WATER TO KEEP YOUR URINE LIGHT YELLOW.   TO REDUCE DIARRHEA/ABDOMINAL PAIN WHICH IS MOST LIKELY DUE TO LACTOSE INTOLERANCE AND A POOR;Y FUNCTIONING PANCRAS:   1. FOLLOW A LOW FAT DIET. MEATS SHOULD BE BAKED, BROILED, OR BOILED. AVOID FRIED FOODS.   2. IF YOU CONSUME DAIRY, ADD LACTASE 3 PILLS WITH MEALS UP TO THREE TIMES A DAY.   3. ADD CREON 2 WITH MEALS AND SNACKS   TO REDUCE COUGH:   1. ADD SYMBICORT WITH AN INHALER 2 PPUFFS TWICE DAILY FOR 14 DAYS.   TO REDUCE DIZZINESS:   1. ADD MECLIZINE THREE TIMES A DAY WHEN NEEDED TO CONTROL THE DIZZINESS. IT CAN CAUSE DROWSINESS.  COMPLETE CT OF THE HEAD.   PLEASE CALL IN ONE MONTH IF YOUR SYMPTOMS IS NOT BETTER.  SEE NEUROLOGY FOR DIZZINESS.  FOLLOW UP IN 4 MOS.

## 2018-10-27 NOTE — Progress Notes (Signed)
Subjective:    Patient ID: Hannah Tapia, female    DOB: 06-27-1929, 85 y.o.   MRN: [id number]  Roderic Scarce, NP   HPI SYMPTOMS OFF AND ON FOR ONE YEAR. NO ENERGY. 3 weeks ago began with dizziness and then the nausea on the same day. BEEN VOMITING ON THE FIRST DAY. GOT A NAUSEA PILL. SAW DR. BARKER DEC 30. THOUGHT IT MIGHT A BEEN A UTI. URINE CULTURE NEGATIVE. WENT BACK AND FOCUSED ON UTI. HAS APPT FOR DR. Volanda Napoleon TO MAKE SURE. BEFORE 2012 SEVERE PANCREATITIS AND ALMOST DIED. GB TAKE OUT EVENTUALLY. HAS DISCOMFORT AND STOMACH. QUALITY OF LIFE IS POOR. WEIGHT DOWN: 5 LBS SINCE JUN 2019. LAST CXR WITHIN THE LAST 6 MOS.  COLD ALL THE TIME. GETS OUT OF BREATH EASILY AND HAS A PACEMAKER. SIGNIFICANT DOE. HAS PAIN OFF IN ON LEFT(NAGGING) AND LOWER(CRAMPY). IF DOES VERY MUCH GETS SEVER BACK PAIN. CAN BE CONSTIPATED(COUPLE DAYS TO HAVE A BM: MOSTLY #3 OR 4). PILLS SOMETIMES CONTROLS HER HEARTBURN(DEPENDS ON WHAT SHE EATS -BURNS IN YOUR THROAT, E.G. DONUTS). SOME MORNINGS COUGHS A LOT AND GETS NAUSEATED AND SOMETIMES SHE THROWS UP AND SOMETIMES SHE DOESN'T BUT NOT EVERY DAY. MAY LAST 10 MINS. GETTING BETTER AND NOW A COUPLE TIMES A WEEK. SEEMS LIKE WHEN SHE GETS NERVOUS IT GETS WORSE. LIVES WITH A GRANDSON WHO SMOKE E CIGS AND CIGS. HAD AN INHALER ONCE. NAUSEA MEDS DID NOT HELP.  HASN'T TRIED MECLIZINE.  PT DENIES FEVER, HEMATOCHEZIA, HEMATEMESIS,  melena, CHEST PAIN, SHORTNESS OF BREATH, CHANGE IN BOWEL IN HABITS, problems swallowing, OR problems with sedation.  Past Medical History:  Diagnosis Date  . Anemia   . Arthritis    bilateral knees  . Biliary acute pancreatitis 2012  . Depression   . Dyspnea   . ESRD (end stage renal disease) ([place])   . GERD (gastroesophageal reflux disease)   . Glaucoma   . History of hiatal hernia   . HTN (hypertension)   . Hyperlipidemia   . Mobitz type 2 second degree atrioventricular block   . Myeloproliferative disorder ([place])   . Polycythemia vera ([place])   .  Presence of permanent cardiac pacemaker    Past Surgical History:  Procedure Laterality Date  . carpel tunnel right    . CHOLECYSTECTOMY    . COLONOSCOPY  03/2006   Dr. West Carbo: extensive diverticulosis sigmoid colon. next TCS 2017.  Marland Kitchen ESOPHAGOGASTRODUODENOSCOPY  08/2012   Dr. West Carbo: diffuse gastritis, distal esophageal erosions. Path: mild chronic gastirits, Barrett's without dysplasia.   Marland Kitchen ESOPHAGOGASTRODUODENOSCOPY  08/2009   Dr. West Carbo: hiatal hernia with probable short segment Barrett's, gastritis. path: gastrisi with intestinal metaplasia (no h.pylori).  Marland Kitchen ESOPHAGOGASTRODUODENOSCOPY N/A 05/02/2018     . KNEE ARTHROSCOPY Bilateral   . PACEMAKER INSERTION     Allergies  Allergen Reactions  . Sulfa Antibiotics Rash   Current Outpatient Medications  Medication Sig    . brimonidine (ALPHAGAN) 0.2 % ophthalmic solution Place 1 drop into both eyes 3 (three) times daily.     . carvedilol (COREG) 12.5 MG tablet Take 12.5 mg by mouth 2 (two) times daily.     . citalopram (CELEXA) 20 MG tablet Take 20 mg by mouth at bedtime.     . ferrous sulfate 325 (65 FE) MG tablet Take 325 mg by mouth every other day.    . hydroxyurea (HYDREA) 500 MG capsule Take 500 mg by mouth daily.     Marland Kitchen latanoprost (XALATAN) 0.005 % ophthalmic solution Place  1 drop into both eyes at bedtime.    Marland Kitchen latanoprost (XALATAN) 0.005 % ophthalmic solution Place 1 drop into both eyes at bedtime.    . meloxicam (MOBIC) 15 MG tablet Take 15 mg by mouth at bedtime.     . nitroGLYCERIN (NITROSTAT) 0.4 MG SL tablet Place 0.4 mg under the tongue every 5 (five) minutes as needed for chest pain.     . pantoprazole (PROTONIX) 40 MG tablet Take 1 tablet (40 mg total) by mouth daily before breakfast.    . pravastatin (PRAVACHOL) 20 MG tablet Take 20 mg by mouth daily.     . ranitidine (ZANTAC) 300 MG capsule Take 300 mg by mouth at bedtime.     . vitamin B-12 (CYANOCOBALAMIN) 1000 MCG tablet Take 2,500 mcg by mouth daily.        Review of Systems PER HPI OTHERWISE ALL SYSTEMS ARE NEGATIVE.     Objective:   Physical Exam Vitals signs reviewed.  Constitutional:      General: She is not in acute distress.    Appearance: She is well-developed.  HENT:     Head: Normocephalic and atraumatic.     Mouth/Throat:     Pharynx: No oropharyngeal exudate.  Eyes:     General: No scleral icterus.    Pupils: Pupils are equal, round, and reactive to light.  Neck:     Musculoskeletal: Normal range of motion and neck supple.  Cardiovascular:     Rate and Rhythm: Normal rate and regular rhythm.     Heart sounds: Normal heart sounds.  Pulmonary:     Effort: Pulmonary effort is normal. No respiratory distress.     Breath sounds: Normal breath sounds.  Abdominal:     General: Bowel sounds are normal. There is no distension.     Palpations: Abdomen is soft.     Tenderness: There is no abdominal tenderness.  Musculoskeletal:     Right lower leg: No edema.     Left lower leg: No edema.  Lymphadenopathy:     Cervical: No cervical adenopathy.  Skin:    General: Skin is warm.  Neurological:     Mental Status: She is alert and oriented to person, place, and time. Mental status is at baseline.     Comments: NO  NEW FOCAL DEFICITS  Psychiatric:        Mood and Affect: Mood normal.        Behavior: Behavior normal.           Assessment & Plan:

## 2018-10-27 NOTE — Assessment & Plan Note (Signed)
LIKELY DUE TO LACTOSE INTOLERANCE/PANCRAETIC INSUFFICIENCY.  DRINK WATER TO KEEP YOUR URINE LIGHT YELLOW.   TO REDUCE DIARRHEA/ABDOMINAL PAIN WHICH IS MOST LIKELY DUE TO LACTOSE INTOLERANCE AND A POOR;Y FUNCTIONING PANCRAS:   1. FOLLOW A LOW FAT DIET. MEATS SHOULD BE BAKED, BROILED, OR BOILED. AVOID FRIED FOODS.   2. IF YOU CONSUME DAIRY, ADD LACTASE 3 PILLS WITH MEALS UP TO THREE TIMES A DAY.   3. ADD CREON 2 WITH MEALS AND SNACKS PLEASE CALL IN ONE MONTH IF YOUR SYMPTOMS IS NOT BETTER.  FOLLOW UP IN 4 MOS.

## 2018-10-27 NOTE — Assessment & Plan Note (Signed)
MOST LIKELY DUE TO GASTRITIS/GERD, VERTIGO, AND POST-TUSSIVE EPISODE.  DRINK WATER TO KEEP YOUR URINE LIGHT YELLOW. TO REDUCE COUGH:   1. ADD SYMBICORT WITH AN INHALER 2 PPUFFS TWICE DAILY FOR 14 DAYS. TO REDUCE DIZZINESS:   1. ADD MECLIZINE THREE TIMES A DAY WHEN NEEDED TO CONTROL THE DIZZINESS. IT CAN CAUSE DROWSINESS. COMPLETE CT OF THE HEAD. PLEASE CALL IN ONE MONTH IF YOUR SYMPTOMS IS NOT BETTER. SEE NEUROLOGY FOR DIZZINESS.  FOLLOW UP IN 4 MOS.

## 2018-10-27 NOTE — Assessment & Plan Note (Signed)
LIKELY DUE TO LACTOSE INTOLERANCE/PANCRAETIC INSUFFICIENCY.  DRINK WATER TO KEEP YOUR URINE LIGHT YELLOW. TO REDUCE DIARRHEA/ABDOMINAL PAIN WHICH IS MOST LIKELY DUE TO LACTOSE INTOLERANCE AND A POOR;Y FUNCTIONING PANCRAS:   1. FOLLOW A LOW FAT DIET. MEATS SHOULD BE BAKED, BROILED, OR BOILED. AVOID FRIED FOODS.   2. IF YOU CONSUME DAIRY, ADD LACTASE 3 PILLS WITH MEALS UP TO THREE TIMES A DAY.   3. ADD CREON 2 WITH MEALS AND SNACKS PLEASE CALL IN ONE MONTH IF YOUR SYMPTOMS IS NOT BETTER.  FOLLOW UP IN 4 MOS.

## 2018-10-28 ENCOUNTER — Telehealth: Payer: Self-pay | Admitting: Gastroenterology

## 2018-10-28 NOTE — Telephone Encounter (Signed)
PT's daughter, Neoma Laming, is aware.

## 2018-10-28 NOTE — Telephone Encounter (Signed)
PLEASE CALL PT. HER CT SHOWS ATROPHY WHICH COMES WITH AGE BUT NO ACUTE STROKE.

## 2018-10-28 NOTE — Progress Notes (Signed)
On recall  °

## 2018-10-31 NOTE — Progress Notes (Signed)
CC'D TO PCP °

## 2019-01-31 ENCOUNTER — Encounter: Payer: Self-pay | Admitting: Gastroenterology

## 2019-03-04 ENCOUNTER — Other Ambulatory Visit: Payer: Self-pay | Admitting: Gastroenterology

## 2020-02-29 IMAGING — CT CT HEAD W/O CM
3 series · 16 of 47 positions shown, 19 images · non-contrast
Comparison: None.

CLINICAL DATA: Nausea, dizziness.

EXAM:
CT HEAD WITHOUT CONTRAST
TECHNIQUE: Contiguous axial images were obtained from the base of the skull
through the vertex without intravenous contrast.

[Series 2: head trauma wo · axial · 0.39mm/px · z∈[+43,+168]mm · 10 of 30 slices shown, 13 images]
[im 3/30  brain]
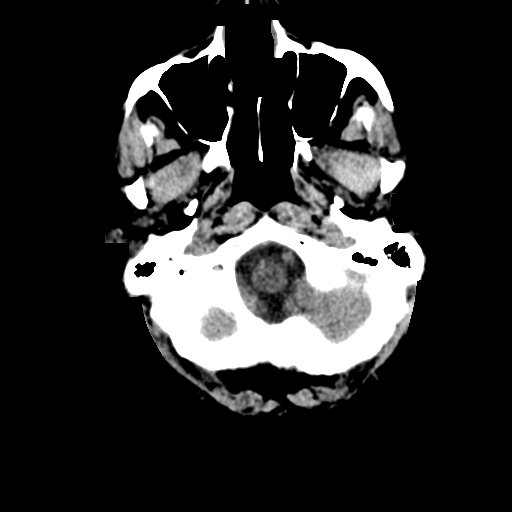
[im 3/30  bone]
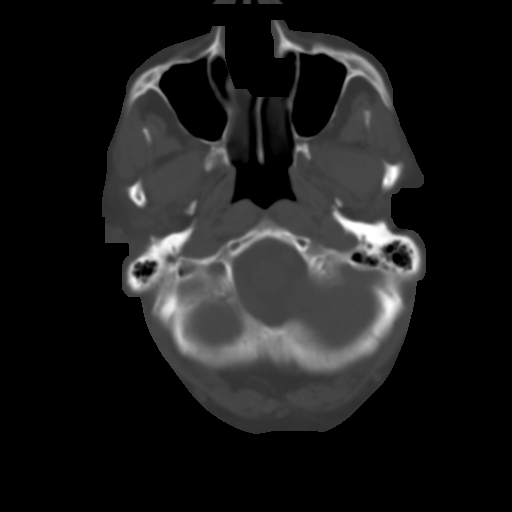
[im 6/30  brain]
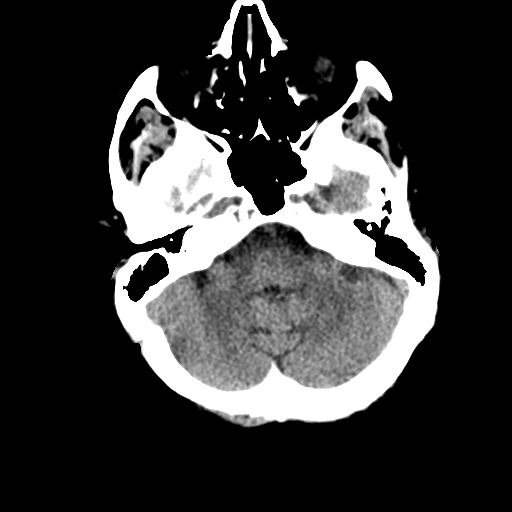
[im 9/30  brain]
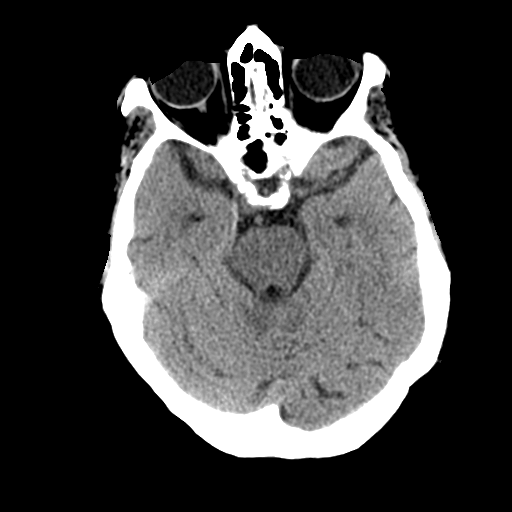
[im 11/30  brain]
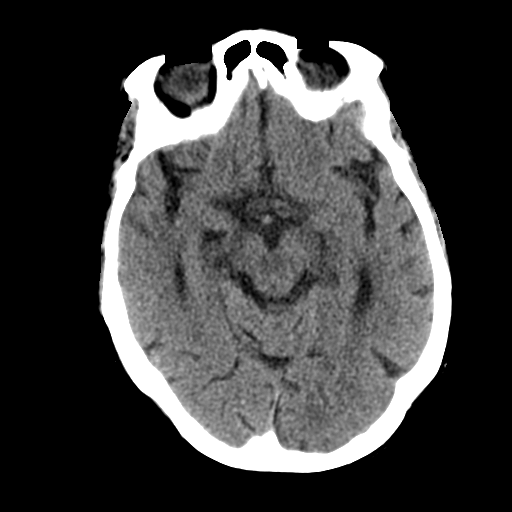
[im 14/30  brain]
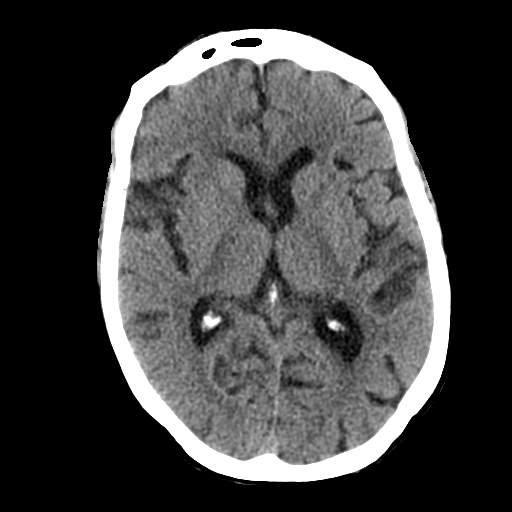
[im 14/30  bone]
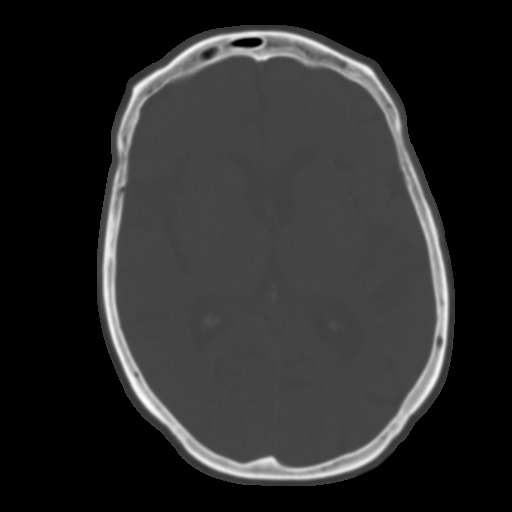
[im 17/30  brain]
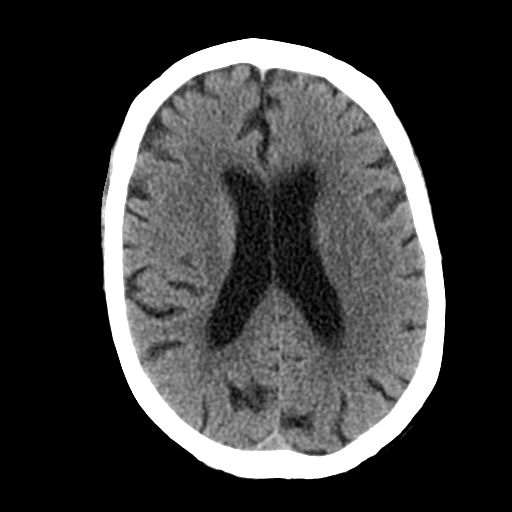
[im 20/30  brain]
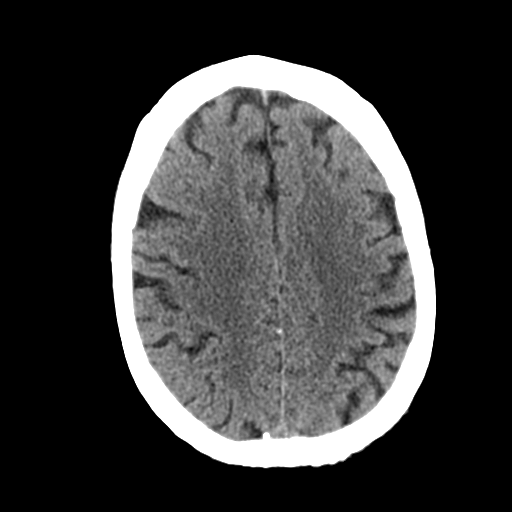
[im 23/30  brain]
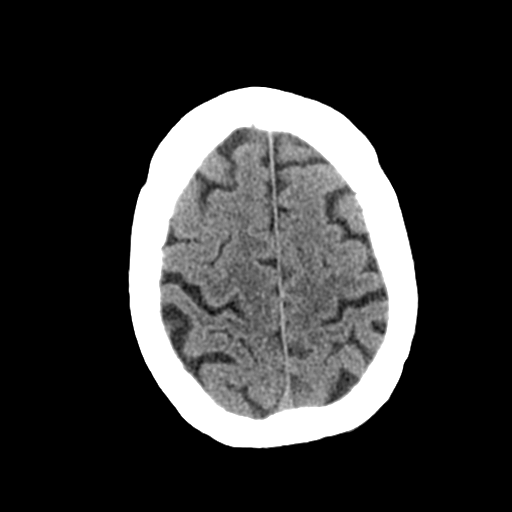
[im 25/30  brain]
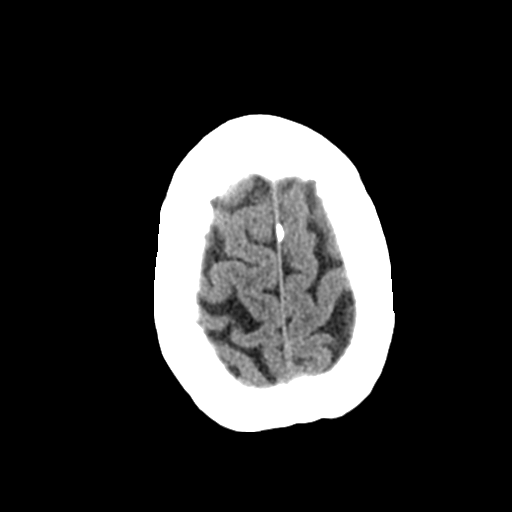
[im 25/30  bone]
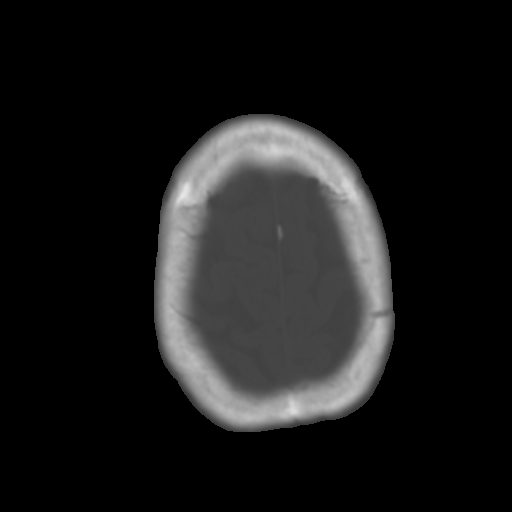
[im 28/30  brain]
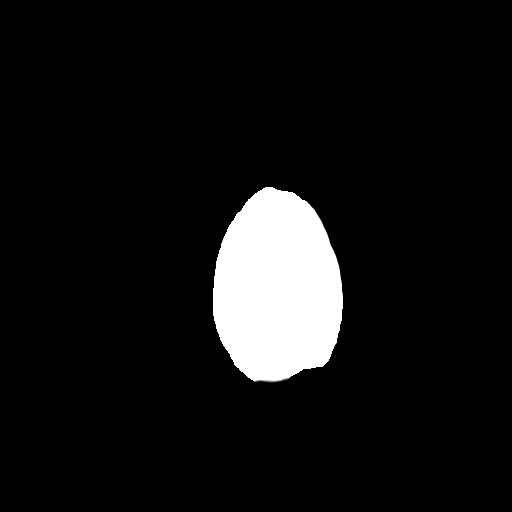

[Series 4: coronal soft tissue · coronal · 0.29mm/px · 3 of 67 slices shown]
[im 23/67  brain]
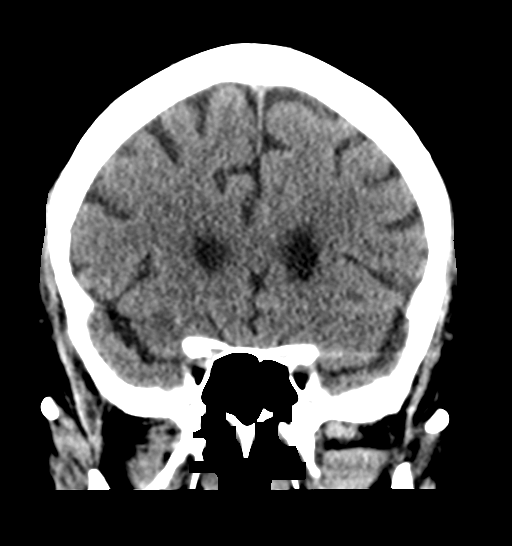
[im 30/67  brain]
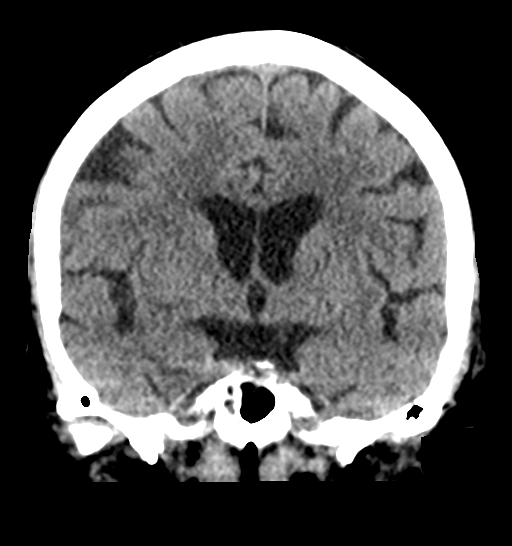
[im 37/67  brain]
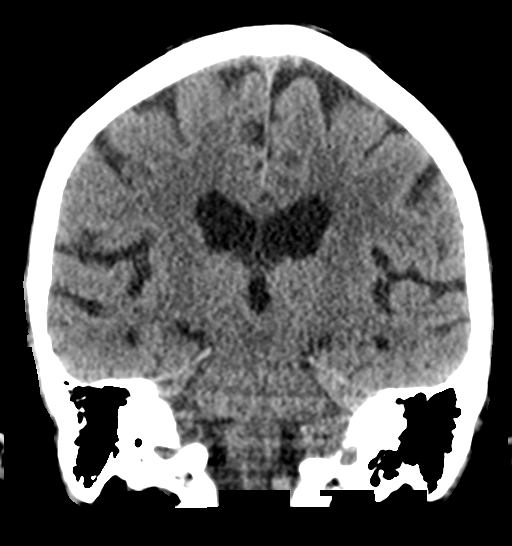

[Series 5: sagittal soft tissue · sagittal · 0.32mm/px · 3 of 53 slices shown]
[im 18/53  brain]
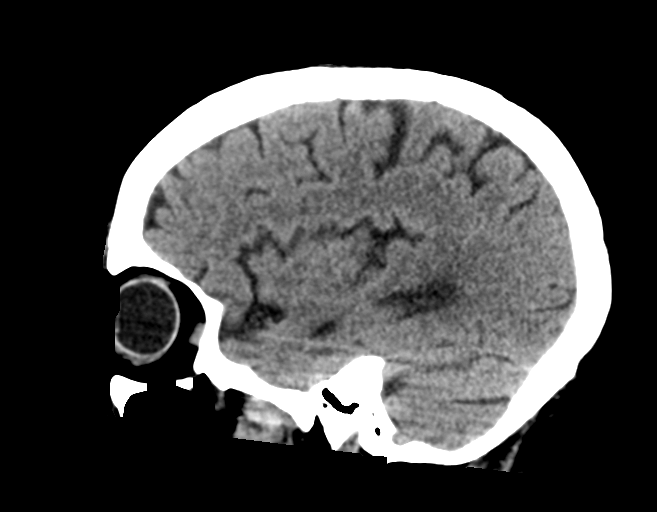
[im 27/53  brain]
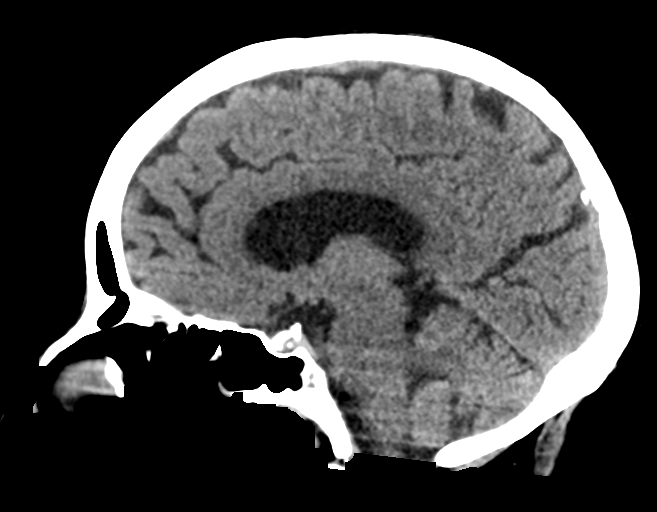
[im 35/53  brain]
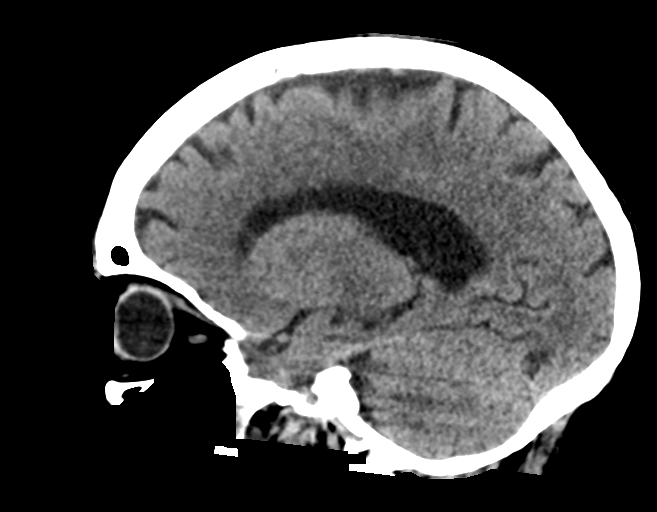

[16 of 47 positions shown; findings below may reference images not displayed]

FINDINGS: Brain: Mild diffuse cortical atrophy is noted. Mild chronic ischemic
white matter disease is noted. No mass effect or midline shift is
noted. Ventricular size is within normal limits. There is no
evidence of mass lesion, hemorrhage or acute infarction.

Vascular: No hyperdense vessel or unexpected calcification.

Skull: Normal. Negative for fracture or focal lesion.

Sinuses/Orbits: No acute finding.

Other: None.
IMPRESSION: Mild diffuse cortical atrophy. Mild chronic ischemic white matter
disease. No acute intracranial abnormality seen.

## 2020-08-06 ENCOUNTER — Telehealth: Payer: Self-pay

## 2020-08-06 ENCOUNTER — Other Ambulatory Visit: Payer: Self-pay

## 2020-08-06 ENCOUNTER — Ambulatory Visit (INDEPENDENT_AMBULATORY_CARE_PROVIDER_SITE_OTHER): Payer: Medicare Other | Admitting: Gastroenterology

## 2020-08-06 ENCOUNTER — Encounter: Payer: Self-pay | Admitting: Gastroenterology

## 2020-08-06 VITALS — BP 112/51 | HR 75 | Temp 97.8°F | Ht 62.0 in | Wt 122.8 lb

## 2020-08-06 DIAGNOSIS — R634 Abnormal weight loss: Secondary | ICD-10-CM

## 2020-08-06 DIAGNOSIS — R109 Unspecified abdominal pain: Secondary | ICD-10-CM

## 2020-08-06 DIAGNOSIS — R112 Nausea with vomiting, unspecified: Secondary | ICD-10-CM | POA: Diagnosis not present

## 2020-08-06 MED ORDER — PANCRELIPASE (LIP-PROT-AMYL) 36000-114000 UNITS PO CPEP: ORAL_CAPSULE | ORAL | 11 refills | Status: AC

## 2020-08-06 NOTE — Progress Notes (Signed)
Referring Provider: Roderic Scarce, MD Primary Care Physician:  Bing Neighbors, NP Primary GI: Dr. Abbey Chatters  Chief Complaint  Patient presents with  . Abdominal Pain    across lower abd and radiates up into upper abd/chest/back. Going on for several months  . Nausea    w/ vomiting, comes/goes  . diarrhea/constipation    HPI:   Hannah Tapia is an 86 y.o. female presenting today with a history of chronic diarrhea felt likely secondary to lactose intolerance/pancreatic insufficiency. Last seen in Jan 2020 by Dr. Oneida Alar. Last EGD in Aug 2019 by Dr. Oneida Alar with mild gastritis/duodenitis, small hiatal hernia. Celiac serologies negative in the past. Chronic intermittent N/V. Here with her daughter today.   Weight loss: June 2019 143 Aug 148 Jan 2020 138 Today 122    Intermittent vomiting. Will wake her up in middle of night. Only eating cheese and crackers, peanut butter crackers. Carnation instant breakfast. Not eating late at night. Ate a hamburger last night homemade. Did ok with vegetable soup. No dysphagia. No overt GI bleeding. More predominant diarrhea. Gets diarrhea with being nervous. Diarrhea is not daily. Stool sometimes looks greasy. Taking Prilosec at night and doing better with reflux.   Severe case of pancreatitis prior to 2012. Had cholecystectomy. Felt similar symptoms a few weeks ago.   Sees [facility] due to history of polycythemia vera. Had to get a blood transfusion about a month ago. Hgb went from 7 to 8 range.  Takes OTC iron.   Last several months worsening pain. Multiple areas. In lower abdomen, epigastric radiates up to chest. Eats only small amounts. Doesn't want anything to eat. No appetite. No pain with eating. Feels fatigued.   Past Medical History:  Diagnosis Date  . Anemia   . Arthritis    bilateral knees  . Biliary acute pancreatitis 2012  . Depression   . Dyspnea   . ESRD (end stage renal disease) ([place])   . GERD (gastroesophageal  reflux disease)   . Glaucoma   . History of hiatal hernia   . HTN (hypertension)   . Hyperlipidemia   . Mobitz type 2 second degree atrioventricular block   . Myeloproliferative disorder ([place])   . Polycythemia vera ([place])   . Presence of permanent cardiac pacemaker     Past Surgical History:  Procedure Laterality Date  . carpel tunnel right    . CHOLECYSTECTOMY    . COLONOSCOPY  03/2006   Dr. West Carbo: extensive diverticulosis sigmoid colon. next TCS 2017.  Marland Kitchen ESOPHAGOGASTRODUODENOSCOPY  08/2012   Dr. West Carbo: diffuse gastritis, distal esophageal erosions. Path: mild chronic gastirits, Barrett's without dysplasia.   [facility] Kitchen ESOPHAGOGASTRODUODENOSCOPY  08/2009   Dr. West Carbo: hiatal hernia with probable short segment Barrett's, gastritis. path: gastrisi with intestinal metaplasia (no h.pylori).  Marland Kitchen ESOPHAGOGASTRODUODENOSCOPY N/A 05/02/2018   Mild gastritis/duodenitis. Small hiatal hernia.   Marland Kitchen KNEE ARTHROSCOPY Bilateral   . PACEMAKER INSERTION      Current Outpatient Medications  Medication Sig Dispense Refill  . brimonidine (ALPHAGAN) 0.2 % ophthalmic solution Place 1 drop into both eyes in the morning and at bedtime.     . carvedilol (COREG) 12.5 MG tablet Take 12.5 mg by mouth 2 (two) times daily.     . citalopram (CELEXA) 20 MG tablet Take 20 mg by mouth at bedtime.     . ferrous sulfate 325 (65 FE) MG tablet Take 325 mg by mouth every other day.    . hydroxyurea (HYDREA)  500 MG capsule Take 500 mg by mouth daily. Takes 1 capsule every other day    . latanoprost (XALATAN) 0.005 % ophthalmic solution Place 1 drop into both eyes at bedtime.  5  . meclizine (ANTIVERT) 12.5 MG tablet 1 PO TID PRN DIZZINESS 30 tablet 0  . meloxicam (MOBIC) 15 MG tablet Take 15 mg by mouth at bedtime.     . Multiple Vitamin (MULTIVITAMIN) tablet Take 1 tablet by mouth daily.    . nitroGLYCERIN (NITROSTAT) 0.4 MG SL tablet Place 0.4 mg under the tongue every 5 (five) minutes as needed for chest pain.     Mar[place] Kitchen  omeprazole (PRILOSEC) 20 MG capsule Take 20 mg by mouth daily. At 11 pm    . pravastatin (PRAVACHOL) 20 MG tablet Take 20 mg by mouth daily.     . budesonide-formoterol (SYMBICORT) 80-4.5 MCG/ACT inhaler Inhale 2 puffs into the lungs 2 (two) times daily for 14 days. (Patient not taking: Reported on 08/06/2020) 1 Inhaler 12   No current facility-administered medications for this visit.    Allergies as of 08/06/2020 - Review Complete 08/06/2020  Allergen Reaction Noted  . Sulfa antibiotics Rash 03/02/2018    Family History  Adopted: Yes    Social History   Socioeconomic History  . Marital status: Widowed    Spouse name: Not on file  . Number of children: Not on file  . Years of education: Not on file  . Highest education level: Not on file  Occupational History  . Not on file  Tobacco Use  . Smoking status: Never Smoker  . Smokeless tobacco: Never Used  Vaping Use  . Vaping Use: Never used  Substance and Sexual Activity  . Alcohol use: Never  . Drug use: Never  . Sexual activity: Not on file  Other Topics Concern  . Not on file  Social History Narrative  . Not on file   Social Determinants of Health   Financial Resource Strain:   . Difficulty of Paying Living Expenses: Not on file  Food Insecurity:   . Worried About Charity fundraiser in the Last Year: Not on file  . Ran Out of Food in the Last Year: Not on file  Transportation Needs:   . Lack of Transportation (Medical): Not on file  . Lack of Transportation (Non-Medical): Not on file  Physical Activity:   . Days of Exercise per Week: Not on file  . Minutes of Exercise per Session: Not on file  Stress:   . Feeling of Stress : Not on file  Social Connections:   . Frequency of Communication with Friends and Family: Not on file  . Frequency of Social Gatherings with Friends and Family: Not on file  . Attends Religious Services: Not on file  . Active Member of Clubs or Organizations: Not on file  . Attends English as a second language teacher Meetings: Not on file  . Marital Status: Not on file    Review of Systems: Gen: Denies fever, chills, anorexia. Denies fatigue, weakness, weight loss.  CV: Denies chest pain, palpitations, syncope, peripheral edema, and claudication. Resp: Denies dyspnea at rest, cough, wheezing, coughing up blood, and pleurisy. GI: see HPI Derm: Denies rash, itching, dry skin Psych: Denies depression, anxiety, memory loss, confusion. No homicidal or suicidal ideation.  Heme: Denies bruising, bleeding, and enlarged lymph nodes.  Physical Exam: BP (!) 112/51   Pulse 75   Temp 97.8 F (36.6 C)   Ht 5\' 2"  (1.575 m)  Wt 122 lb 12.8 oz (55.7 kg)   BMI 22.46 kg/m  General:   Alert and oriented. No distress noted. Pleasant and cooperative.  Head:  Normocephalic and atraumatic. Eyes:  Conjuctiva clear without scleral icterus. Mouth:  Mask in place Abdomen:  +BS, soft, non-tender and non-distended. No rebound or guarding. No HSM or masses noted. Msk:  Symmetrical without gross deformities. Normal posture. Extremities:  Without edema. Neurologic:  Alert and  oriented x4 Psych:  Alert and cooperative. Normal mood and affect.  ASSESSMENT: NIANNA IGO is an 86 y.o. female presenting today with a history of chronic diarrhea felt likely secondary to lactose intolerance/pancreatic insufficiency, history of gastritis/duodenitis with last EGD in Aug 2019, intermittent N/V now worsening, worsening abdominal pain, and documented weight loss. History of polycythemia vera and followed by Oncology with [facility], reporting drop in Hgb and transfusion recently. I have requested records.   Diarrhea intermittent and will place empirically on Creon 36,000 units, taking 2 with meals and 1 with snacks. It appears she never started this when prescribed by Dr. Oneida Alar over a year ago.   CT abdomen pelvis with oral contrast only has been ordered for [place]. Renal status precludes IV contrast. She does  not have any postprandial pain, but her weight loss is concerning. May ultimately need mesenteric duplex if concern for chronic mesenteric ischemia.   History of blood transfusion: known polycythemia vera followed by Oncology, and she reports needing blood transfusion and on oral iron. Last colonoscopy in remote past, over 10 years ago. May need to consider repeat EGD in light of abdominal pain, N/V. Obtaining outside labs from [facility].    PLAN:  Continue PPI daily  CT abd/pelvis ordered in [place]  Obtaining outside labs  Creon 2 capsules with meals and 1 with snacks. Samples and prescription provided  May need EGD  Further recommendations to follow   Annitta Needs, PhD, ANP-BC [facility]

## 2020-08-06 NOTE — Patient Instructions (Signed)
I have provided samples of Creon to take 2 capsules with meals and 1 with snacks. This is for pancreatic insufficiency.  We have ordered a CT scan (no IV contrast) to further evaluate abdominal pain.  Continue omeprazole (Prilosec) once daily on an empty stomach (best absorbed on an empty stomach).  I am requesting outside labs from the Oncologist.  Further recommendations to follow!  It was a pleasure to see you today. I want to create trusting relationships with patients to provide genuine, compassionate, and quality care. I value your feedback. If you receive a survey regarding your visit,  I greatly appreciate you taking time to fill this out.   Annitta Needs, PhD, ANP-BC [facility]

## 2020-08-06 NOTE — Telephone Encounter (Signed)
CT abd/pelvis w/o contrast order and face sheet faxed to [facility] per pt request.

## 2020-08-08 NOTE — Progress Notes (Signed)
Cc'ed to pcp °

## 2020-08-20 ENCOUNTER — Telehealth: Payer: Self-pay | Admitting: Internal Medicine

## 2020-08-20 NOTE — Telephone Encounter (Signed)
Phoned and spoke to pt's daughter advised her we haven't received the results yet but I assured her when we do I will call her.

## 2020-08-20 NOTE — Telephone Encounter (Signed)
Pt's daughter called to say that patient had CT scan last Thursday and hasn't heard back regarding results. Please call [phone]

## 2020-08-20 NOTE — Telephone Encounter (Signed)
CT abdomen/pelvis without contrast received from [place]:  No acute abnormalities. Dilated CBD noted at 14 mm, similar to prior. Gallbladder absent.   We need to check an HFP. She may ultimately need EGD. Let's check HFP and go from there.

## 2020-08-21 NOTE — Telephone Encounter (Signed)
Pt's daughter returned call I advised of the results and that Roseanne Kaufman, [place] wants to start with HFP done. Pt's daughter agreed and that she wants to do this in [place]. States she will call me the first of next week and let me know which facility they are going to because there is one in particular they go to.

## 2020-08-21 NOTE — Telephone Encounter (Signed)
Phoned pt's daughter LM on vm to return call regarding her mother's CT report.

## 2020-08-26 ENCOUNTER — Other Ambulatory Visit: Payer: Self-pay

## 2020-08-26 ENCOUNTER — Telehealth: Payer: Self-pay | Admitting: Internal Medicine

## 2020-08-26 DIAGNOSIS — R197 Diarrhea, unspecified: Secondary | ICD-10-CM

## 2020-08-26 NOTE — Telephone Encounter (Signed)
Pt wants to have her labs done at [facility] in [place] and wants to have it done this Wednesday while she's there getting labs done for another doctor.

## 2020-08-26 NOTE — Telephone Encounter (Signed)
CALLED AND SPOKE TO PT'S DAUGHTER AND SHE WANTS PT'S LAB FORM FAXED TO [facility] IN [place] (SHE HAS TO DO BLOODWORK DONE Wednesday FOR HER ONCOLOGISTS). FAXED LAB ORDER TO [place].

## 2020-08-26 NOTE — Telephone Encounter (Signed)
Spoke with pt's daughter and she wanted lab form sent to [facility] in [place], [place]. Where her mother will be doing labs on Wednesday December 1st. Faxed lab forms to [facility].

## 2020-08-29 NOTE — Telephone Encounter (Signed)
HFP received dated 08/28/20:   All normal. I would hold off on further imaging (such as MRI) as LFTs normal.   I do think she would benefit from an EGD due to worsening dyspepsia with Dr. Abbey Chatters.   ASA 3

## 2020-08-29 NOTE — Telephone Encounter (Signed)
Phoned and spoke with the pt's daughter today advised of the lab results and advise of Dr. Abbey Chatters regarding the benefit from an EGD.

## 2020-09-02 NOTE — Telephone Encounter (Signed)
Tried to call pt's daughter Neoma Laming), LMOVM for return call.

## 2020-09-03 NOTE — Telephone Encounter (Signed)
Spoke to pt's daughter Neoma Laming), she is feeling better now. She will talk to her mom and if she wants to do EGD she will call back to schedule.

## 2020-09-30 ENCOUNTER — Telehealth: Payer: Self-pay | Admitting: Gastroenterology

## 2020-09-30 NOTE — Telephone Encounter (Signed)
Oncology notes reviewed. She had worsening anemia after Hydrea adjusted, which was then adjusted. Continues to follow closely with Oncology at [facility].   As of Aug 15, 2020: Hgb 8, Hct 25.8, platelets 1058, WBC count 12.22.

## 2023-11-27 DEATH — deceased
